# Patient Record
Sex: Male | Born: 1950 | Hispanic: No | Marital: Married | Smoking: Current every day smoker
Health system: Southern US, Community
[De-identification: ages and names within clinical notes are randomized; demographics above are authoritative.]

---

## 2010-04-02 ENCOUNTER — Encounter: Admission: RE | Admit: 2010-04-02 | Discharge: 2010-04-02 | Payer: Self-pay | Admitting: Specialist

## 2011-09-01 IMAGING — CR DG CHEST 1V
1 series · 1 of 1 positions shown · non-contrast
Comparison: None.

CLINICAL DATA: Positive PPD test.

CHEST - 1 VIEW

[view not recorded]
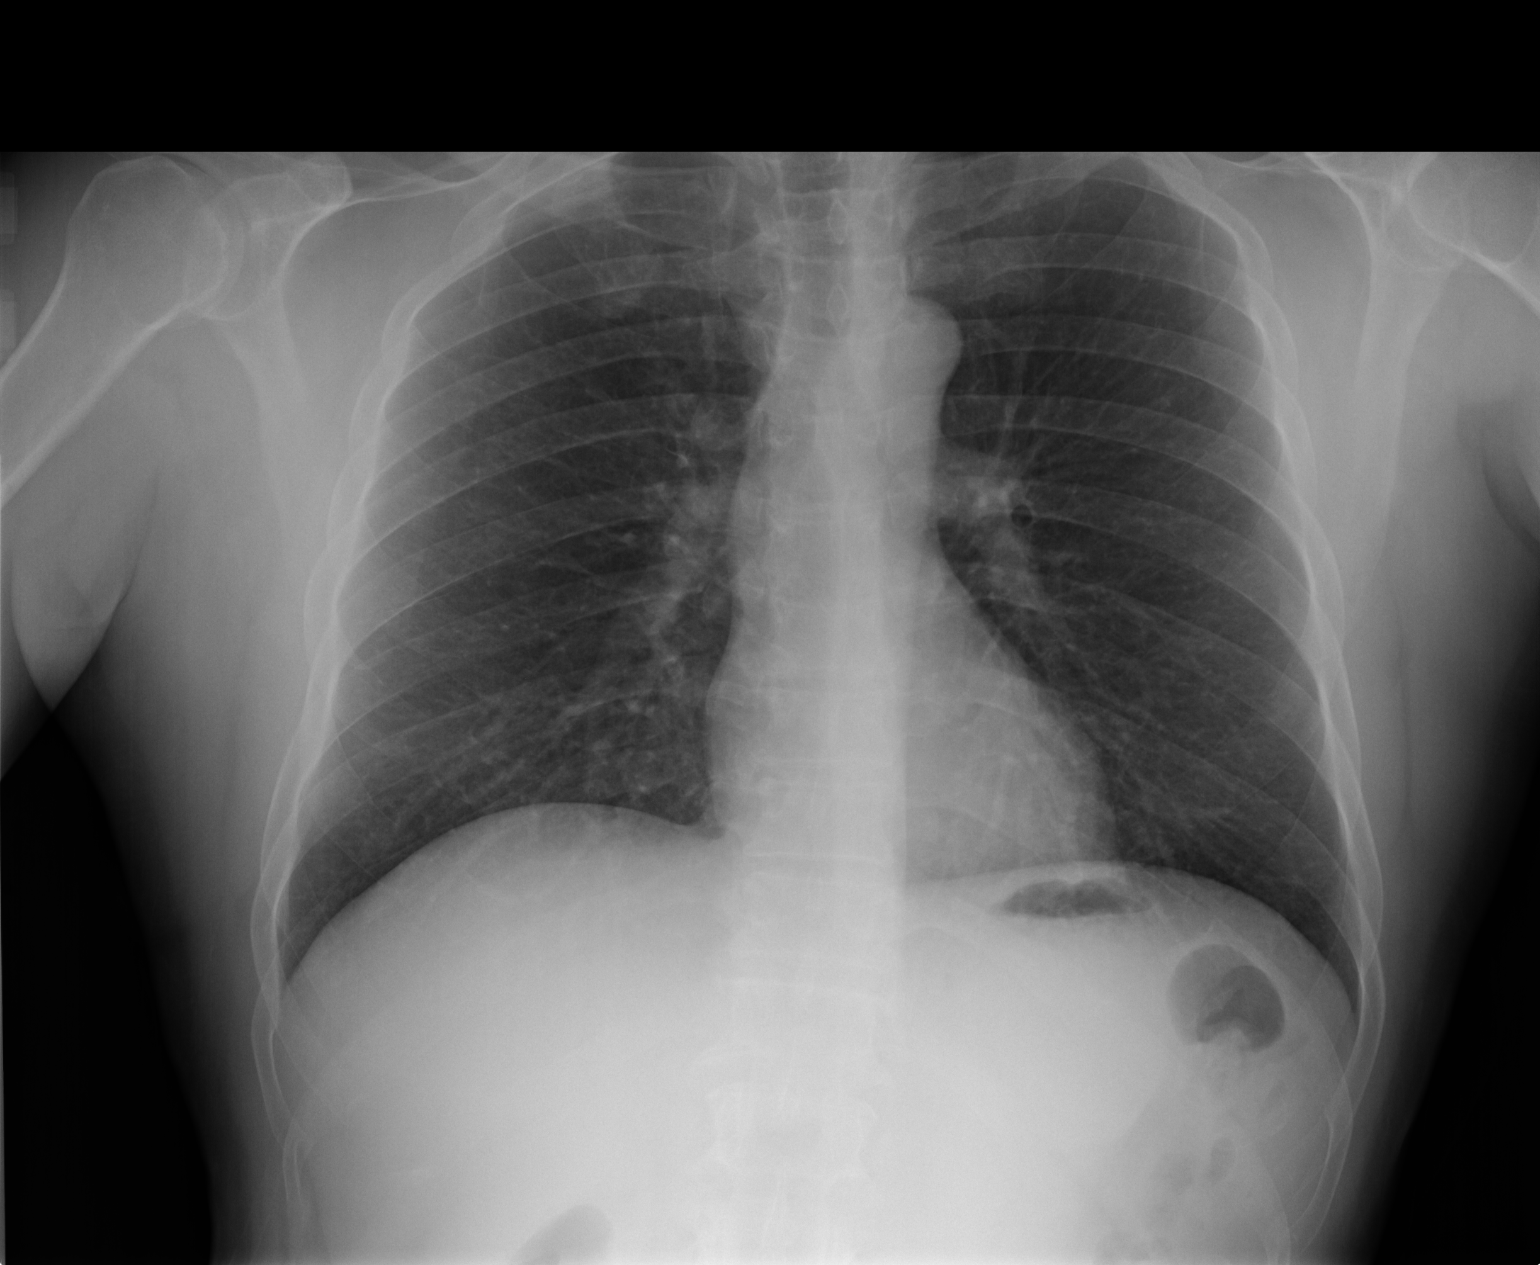

[1 of 1 positions shown; findings below may reference images not displayed]

FINDINGS: Trachea is midline.  Heart size normal.  There is mild
asymmetric prominence of the right first costochondral junction.
Lungs are clear.  No pleural fluid.
IMPRESSION: No acute findings.  No evidence of active tuberculosis.

## 2014-12-30 ENCOUNTER — Emergency Department (HOSPITAL_COMMUNITY): Payer: Self-pay

## 2014-12-30 ENCOUNTER — Encounter (HOSPITAL_COMMUNITY): Payer: Self-pay | Admitting: Nurse Practitioner

## 2014-12-30 ENCOUNTER — Emergency Department (HOSPITAL_COMMUNITY)
Admission: EM | Admit: 2014-12-30 | Discharge: 2014-12-30 | Disposition: A | Discharge status: Home / Self Care | Payer: Self-pay | Attending: Emergency Medicine | Admitting: Emergency Medicine

## 2014-12-30 DIAGNOSIS — Z791 Long term (current) use of non-steroidal anti-inflammatories (NSAID): Secondary | ICD-10-CM | POA: Insufficient documentation

## 2014-12-30 DIAGNOSIS — Z720 Tobacco use: Secondary | ICD-10-CM | POA: Insufficient documentation

## 2014-12-30 DIAGNOSIS — R0602 Shortness of breath: Secondary | ICD-10-CM | POA: Insufficient documentation

## 2014-12-30 DIAGNOSIS — R05 Cough: Secondary | ICD-10-CM | POA: Insufficient documentation

## 2014-12-30 LAB — BASIC METABOLIC PANEL
ANION GAP: 7 (ref 5–15)
BUN: 16 mg/dL (ref 6–23)
CHLORIDE: 102 mmol/L (ref 96–112)
CO2: 29 mmol/L (ref 19–32)
CREATININE: 0.8 mg/dL (ref 0.50–1.35)
Calcium: 9.6 mg/dL (ref 8.4–10.5)
GLUCOSE: 103 mg/dL — AB (ref 70–99)
POTASSIUM: 4.9 mmol/L (ref 3.5–5.1)
Sodium: 138 mmol/L (ref 135–145)

## 2014-12-30 LAB — CBC
HCT: 43.5 % (ref 39.0–52.0)
Hemoglobin: 15.4 g/dL (ref 13.0–17.0)
MCH: 28.7 pg (ref 26.0–34.0)
MCHC: 35.4 g/dL (ref 30.0–36.0)
MCV: 81.2 fL (ref 78.0–100.0)
Platelets: 231 10*3/uL (ref 150–400)
RBC: 5.36 MIL/uL (ref 4.22–5.81)
RDW: 12.9 % (ref 11.5–15.5)
WBC: 4.3 10*3/uL (ref 4.0–10.5)

## 2014-12-30 LAB — I-STAT TROPONIN, ED: TROPONIN I, POC: 0 ng/mL (ref 0.00–0.08)

## 2014-12-30 LAB — BRAIN NATRIURETIC PEPTIDE: B NATRIURETIC PEPTIDE 5: 32.8 pg/mL (ref 0.0–100.0)

## 2014-12-30 MED ORDER — ALBUTEROL SULFATE HFA 108 (90 BASE) MCG/ACT IN AERS
2.0000 | INHALATION_SPRAY | Freq: Once | RESPIRATORY_TRACT | Status: AC
  Administered 2014-12-30: 2 via RESPIRATORY_TRACT
  Filled 2014-12-30: qty 6.7

## 2014-12-30 MED ORDER — IPRATROPIUM-ALBUTEROL 0.5-2.5 (3) MG/3ML IN SOLN
3.0000 mL | RESPIRATORY_TRACT | Status: DC
  Administered 2014-12-30: 3 mL via RESPIRATORY_TRACT
  Filled 2014-12-30: qty 3

## 2014-12-30 NOTE — ED Notes (Signed)
Pt alert,oriented, and ambulatory upon DC. Lung sounds clear. Pt was advised to follow up with PCP ( resources provided).

## 2014-12-30 NOTE — ED Provider Notes (Signed)
CSN: [id number]     Arrival date & time 12/30/14  1505 History   First MD Initiated Contact with Patient 12/30/14 1522     Chief Complaint  Patient presents with  . Shortness of Breath     (Consider location/radiation/quality/duration/timing/severity/associated sxs/prior Treatment) Patient is a 64 y.o. male presenting with shortness of breath. The history is provided by the patient.  Shortness of Breath Severity:  Mild Onset quality:  Gradual Duration:  4 days Timing:  Sporadic Progression:  Unchanged Chronicity:  New Context: weather changes   Context: not activity, not animal exposure and not URI   Relieved by:  Nothing Worsened by:  Nothing tried Associated symptoms: cough (occasional, nonproductive)   Associated symptoms: no abdominal pain, no chest pain, no fever and no vomiting     History reviewed. No pertinent past medical history. History reviewed. No pertinent past surgical history. History reviewed. No pertinent family history. History  Substance Use Topics  . Smoking status: Current Every Day Smoker -- 2.00 packs/day    Types: Cigarettes  . Smokeless tobacco: Current User  . Alcohol Use: No    Review of Systems  Constitutional: Negative for fever.  Respiratory: Positive for cough (occasional, nonproductive) and shortness of breath.   Cardiovascular: Negative for chest pain and leg swelling.  Gastrointestinal: Negative for vomiting and abdominal pain.  All other systems reviewed and are negative.     Allergies  Review of patient's allergies indicates no known allergies.  Home Medications   Prior to Admission medications   Medication Sig Start Date End Date Taking? Authorizing Provider  naproxen sodium (ANAPROX) 220 MG tablet Take 440 mg by mouth daily as needed (pain).   Yes Historical Provider, MD   BP 147/79 mmHg  Pulse 68  Temp(Src) 98 F (36.7 C) (Oral)  Resp 16  Ht  (1.549 m)  Wt 172 lb (78.019 kg)  BMI 32.52 kg/m2 Physical Exam   Constitutional: He is oriented to person, place, and time. He appears well-developed and well-nourished. No distress.  HENT:  Head: Normocephalic and atraumatic.  Mouth/Throat: No oropharyngeal exudate.  Eyes: EOM are normal. Pupils are equal, round, and reactive to light.  Neck: Normal range of motion. Neck supple.  Cardiovascular: Normal rate and regular rhythm.  Exam reveals no friction rub.   No murmur heard. Pulmonary/Chest: Effort normal and breath sounds normal. No respiratory distress. He has no wheezes. He has no rales.  Abdominal: He exhibits no distension. There is no tenderness. There is no rebound.  Musculoskeletal: Normal range of motion. He exhibits no edema.  Neurological: He is alert and oriented to person, place, and time.  Skin: No rash noted. He is not diaphoretic.  Nursing note and vitals reviewed.   ED Course  Procedures (including critical care time) Labs Review Labs Reviewed - No data to display  Imaging Review Dg Chest 2 View  12/30/2014   CLINICAL DATA:  Shortness of breath for the past 4 days. Occasional smoker.  EXAM: CHEST  2 VIEW  COMPARISON:  04/02/2010.  FINDINGS: Normal sized heart. Clear lungs. Mild thoracolumbar spine degenerative changes.  IMPRESSION: No acute abnormality.   Electronically Signed   By: Beckie Salts M.D.   On: 12/30/2014 16:20     EKG Interpretation   Date/Time:  Sunday December 30 2014 15:19:12 EST Ventricular Rate:  69 PR Interval:  176 QRS Duration: 72 QT Interval:  441 QTC Calculation: 472 R Axis:   16 Text Interpretation:  Sinus rhythm Baseline wander  in lead(s) III No prior  for comparison Confirmed by [facility]  MD, Amiel Sharrow ([id number]) on 12/30/2014 3:23:30  PM      MDM   Final diagnoses:  Shortness of breath    64 year old male here with shortness of breath. He is [place] and has been here for 2 months. Began 4 days ago. Mild occasional cough. No chest pain, vomiting, fever. Better with going outside and getting fresh  air, no worsening factors. Occasional smoker. No medical problems. Here vitals are stable exam is benign. Will plan for chest x-ray, labs, EKG. CXR normal. Labs ok. Feeling better after Duoneb, given albuterol inhaler to go home. Could be due to humid air here, as we've had some severe weather changes in the past few weeks with a lot of rain. [place] is mostly dry.  Given albuterol inhaler to go home and resource guide to help him find a PCP.  Elwin MochaBlair Mica Releford, MD 12/30/14 [phone]

## 2014-12-30 NOTE — Discharge Instructions (Signed)
Shortness of Breath °Shortness of breath means you have trouble breathing. It could also mean that you have a medical problem. You should get immediate medical care for shortness of breath. °CAUSES  °· Not enough oxygen in the air such as with high altitudes or a smoke-filled room. °· Certain lung diseases, infections, or problems. °· Heart disease or conditions, such as angina or heart failure. °· Low red blood cells (anemia). °· Poor physical fitness, which can cause shortness of breath when you exercise. °· Chest or back injuries or stiffness. °· Being overweight. °· Smoking. °· Anxiety, which can make you feel like you are not getting enough air. °DIAGNOSIS  °Serious medical problems can often be found during your physical exam. Tests may also be done to determine why you are having shortness of breath. Tests may include: °· Chest X-rays. °· Lung function tests. °· Blood tests. °· An electrocardiogram (ECG). °· An ambulatory electrocardiogram. An ambulatory ECG records your heartbeat patterns over a 24-hour period. °· Exercise testing. °· A transthoracic echocardiogram (TTE). During echocardiography, sound waves are used to evaluate how blood flows through your heart. °· A transesophageal echocardiogram (TEE). °· Imaging scans. °Your health care provider may not be able to find a cause for your shortness of breath after your exam. In this case, it is important to have a follow-up exam with your health care provider as directed.  °TREATMENT  °Treatment for shortness of breath depends on the cause of your symptoms and can vary greatly. °HOME CARE INSTRUCTIONS  °· Do not smoke. Smoking is a common cause of shortness of breath. If you smoke, ask for help to quit. °· Avoid being around chemicals or things that may bother your breathing, such as paint fumes and dust. °· Rest as needed. Slowly resume your usual activities. °· If medicines were prescribed, take them as directed for the full length of time directed. This  includes oxygen and any inhaled medicines. °· Keep all follow-up appointments as directed by your health care provider. °SEEK MEDICAL CARE IF:  °· Your condition does not improve in the time expected. °· You have a hard time doing your normal activities even with rest. °· You have any new symptoms. °SEEK IMMEDIATE MEDICAL CARE IF:  °· Your shortness of breath gets worse. °· You feel light-headed, faint, or develop a cough not controlled with medicines. °· You start coughing up blood. °· You have pain with breathing. °· You have chest pain or pain in your arms, shoulders, or abdomen. °· You have a fever. °· You are unable to walk up stairs or exercise the way you normally do. °MAKE SURE YOU: °· Understand these instructions. °· Will watch your condition. °· Will get help right away if you are not doing well or get worse. °Document Released: 07/21/2001 Document Revised: 10/31/2013 Document Reviewed: 01/11/2012 °ExitCare® Patient Information ©2015 [employer]. This information is not intended to replace advice given to you by your health care provider. Make sure you discuss any questions you have with your health care provider. ° ° ° °Emergency Department Resource Guide °1) Find a Doctor and Pay Out of Pocket °Although you won't have to find out who is covered by your insurance plan, it is a good idea to ask around and get recommendations. You will then need to call the office and see if the doctor you have chosen will accept you as a new patient and what types of options they offer for patients who are self-pay. Some doctors   offer discounts or will set up payment plans for their patients who do not have insurance, but you will need to ask so you aren't surprised when you get to your appointment. ° °2) Contact Your Local Health Department °Not all health departments have doctors that can see patients for sick visits, but many do, so it is worth a call to see if yours does. If you don't know where your local health  department is, you can check in your phone book. The CDC also has a tool to help you locate your state's health department, and many state websites also have listings of all of their local health departments. ° °3) Find a Walk-in Clinic °If your illness is not likely to be very severe or complicated, you may want to try a walk in clinic. These are popping up all over the country in pharmacies, drugstores, and shopping centers. They're usually staffed by nurse practitioners or physician assistants that have been trained to treat common illnesses and complaints. They're usually fairly quick and inexpensive. However, if you have serious medical issues or chronic medical problems, these are probably not your best option. ° °No Primary Care Doctor: °- Call [facility] at  832-8000 - they can help you locate a primary care doctor that  accepts your insurance, provides certain services, etc. °- Physician Referral Service- 1-800-533-3463 ° °Chronic Pain Problems: °Organization         Address  Phone   Notes  ° [facility]  (336) 297-2271 Patients need to be referred by their primary care doctor.  ° °Medication Assistance: °Organization         Address  Phone   Notes  °[place] [facility] [address] °[place], [place] [address] (336) 641-8030 --Must be a resident of [place] °-- Must have NO insurance coverage whatsoever (no Medicaid/ Medicare, etc.) °-- The pt. MUST have a primary care doctor that directs their care regularly and follows them in the community °  °[facility]  (866) 331-1348   °[employer] Way  (888) 892-1162   ° °Agencies that provide inexpensive medical care: °Organization         Address  Phone   Notes  °[facility]  (336) 832-8035   °[facility]    (336) 832-7272   °[facility] [address] °[place], [place] [address] (336) 832-4777   °[facility] [address], °[place] (336) 271-4999   °[facility]    (336) 373-0678   °[facility]    (336) 272-1050   °[facility] ° [address], [place] Phone:  (336) 832-4444, Fax:  (336) 832-4440 Hours of Operation:  9 am - 6 pm, M-F.  Also accepts Medicaid/Medicare and self-pay.  °[facility] ° [address], [place] Phone: (336) 832-3150, Fax: (336) 832-3151. Hours of Operation:  8:30 am - 5:30 pm, M-F.  Also accepts Medicaid and self-pay.  °[facility] [place] [address], [place] Phone: (336) 878-6027   °[facility] [address], [place], [place] (336)723-1848, Ext. 123 Mondays & Thursdays: 7-9 AM.  First 15 patients are seen on a first come, first serve basis. °  ° °Medicaid-accepting [place] Providers: ° °Organization         Address  Phone   Notes  °[facility] [address], [place] (336) 641-2100 Also accepts self-pay patients.  °[facility] [address], [place] ° (336) 856-9996   °[facility] [address], [place] (336) 288-8857   °[facility] [address] [place] [address], [place] (336) 299-7000   °Veita Bland [address], [place]  ° (336) 373-1557 Only accepts [place] Access Medicaid patients after they have their name applied to their card.  ° °Self-Pay (no insurance) in [place]: ° °Organization         Address  Phone   Notes  °Sickle Cell Patients, [facility] [address], [place] (336) 832-1970   °[facility] [address], [place] (336) 832-4400   °[facility] [place] ° [address], [place] (336) 992-4800   °[facility]/Dr. Osei-Bonsu ° [address] [place] [address], [place] or [address], [place] (336) 841-8500 Phone number for both [place] and  [place] locations is the same.  °[facility] [address], [place] (336) 299-0000   °[facility] [address] [place] [address], [place] or [address] (336) 852-7530 °(336) 878-2260   °[facility] [address], [place] (336) 350-1642, phone; (336) 294-5005, fax Sees patients 1st and 3rd Saturday of every month.  Must not qualify for public or private insurance (i.e. Medicaid, Medicare, [place] Health Choice, Veterans' Benefits) • Household income should be no more than 200% of the poverty level •The clinic cannot treat you if you are pregnant or think you are pregnant • Sexually transmitted diseases are not treated at the clinic.  ° ° °Dental Care: °Organization         Address  Phone  Notes  °[facility] [address],  (336) 641-6152 Accepts children up to age 21 who are enrolled in Medicaid or [place] Health Choice; pregnant women with a Medicaid card; and children who have applied for Medicaid or [place] Health Choice, but were declined, whose parents can pay a reduced fee at time of service.  °[facility] [place]  [address], [place] (336) 641-7733 Accepts children up to age 21 who are enrolled in Medicaid or [place] Health Choice; pregnant women with a Medicaid card; and children who have applied for Medicaid or [place] Health Choice, but were declined, whose parents can pay a reduced fee at time of service.  °[facility] ° [address],  (336) 641-4533 Patients are seen by appointment only. Walk-ins are not accepted. [facility] will see patients 18 years of age and older. °Monday - Tuesday (8am-5pm) °Most Wednesdays (8:30-5pm) °$30 per visit, cash only  °[facility] ° [address], [place] (336) 641-4533 Patients are seen by appointment only. Walk-ins are not accepted.  [facility] will see patients 18 years of age and older. °One Wednesday Evening (Monthly: Volunteer Based).  $30 per visit, cash only  °[facility]  (919) 537-3737 for adults; Children under age 4, call Graduate Pediatric Dentistry at (919) [phone]. Children aged 4-14, please call (919) [phone] to request a pediatric application. ° Dental services are provided in all areas of dental care including fillings, crowns and bridges, complete and partial dentures, implants, gum treatment, root canals, and extractions. Preventive care   is also provided. Treatment is provided to both adults and children. °Patients are selected via a lottery and there is often a waiting list. °  °[facility] [address], °[place] ° (336) 763-8833 [email or web address] °  °[facility] [address], [place], [place] (336)723-1848, Ext. 123 Second and Fourth Thursday of each month, opens at 6:30 AM; Clinic ends at 9 AM.  Patients are seen on a first-come first-served basis, and a limited number are seen during each clinic.  ° °[facility] ° [address], [place], [place] (336) 723-7904   Eligibility Requirements °You must have lived in [place], [place], or [place] counties for at least the last three months. °  You cannot be eligible for state or federal sponsored healthcare insurance, including Veterans Administration, Medicaid, or Medicare. °  You generally cannot be eligible for healthcare insurance through your employer.  °  How to apply: °Eligibility screenings are held every Tuesday and Wednesday afternoon from 1:00 pm until 4:00 pm. You do not need an appointment for the interview!  °[facility] [address], [place], [place] 336-631-2330   °[facility]  336-342-8273   °[facility]  336-703-3100   °[facility]  336-570-6415   ° °Behavioral Health Resources in the  Community: °Intensive Outpatient Programs °Organization         Address  Phone  Notes  °[facility] [address], [place], [place] 336-878-6098   °[facility] [address], [place], [place] 336-832-9800   °[facility] [address], [place], [place] ° 336-882-2125   °[facility] [address],  °[place], [place] 1-800-853-5163 or 336-641-4981   °Substance Abuse Resources °Organization         Address  Phone  Notes  °[facility]  336-882-2125   °[facility]  336-784-9470   °[facility]  336-285-9073   °[facility]  336-845-3988   °Residential & Outpatient Substance Abuse Program  1-800-659-3381   °Psychological Services °Organization         Address  Phone  Notes  °[facility]  336- 832-9600   °[facility]  336- 378-7881   °[facility] [address], [place] 1-800-853-5163 or 336-641-4981   ° °Mobile Crisis Teams °Organization         Address  Phone  Notes  °[facility], Mobile Crisis Care Unit  1-877-626-1772   °Assertive °[facility] ° [address] [place], [place] 336-834-9664   °Sharon DeEsch [address] °[place] 336-554-5454   ° °Self-Help/Support Groups °Organization         Address  Phone             Notes  °[facility] - variety of support groups  336- 373-1402 Call for more information  °Narcotics Anonymous (NA), [facility] [address], °[place]  2 meetings at this location  ° °Residential Treatment Programs °Organization         Address  Phone  Notes  °[facility] [address],    °[place]  1-866-801-8205   °[facility] ° [address], [place], [place] 704-293-8524   °[facility] [address], [place] 336-845-3988 Admissions: 8am-3pm M-F  °[facility] [address]    °  [place], [place] 336-841-1104   °[facility] [address], [place], [place] 336-379-7146   °[facility] [address]  °[place], [place] 336-285-9073   °[facility] Intensive Outpatient [address], [place], [place] 336-852-3033   °[facility] [address]  °[place], [place] 1-877-615-2722 or 336-784-9470   °[facility] ([facility] [address] [place], [place] 336-227-7417 Accepts Medicaid  °[facility] [address]  ° [place] 1-800-659-3381 Substance Abuse/Addiction Treatment  ° °[place] Behavioral Health Resources °Organization         Address  Phone  Notes  °[facility]  (888) 581-9988   °Julie Brannon, PhD [address] [place], [place]   (336) 349-5553 or (336) 951-0000   °[facility]   [address] °[place], [place] (336) 349-4454   °[facility] [address], [place], [place] (336) 342-8316 Insurance/Medicaid/sponsorship through [facility]  °[facility] [address]                                    [place], [place] (336) 342-8316 Therapy/tele-psych/case  °[facility] [address]  ° [place], [place] (336) 349-2233    °Dr. Arfeen  (336) 349-4544   °[facility] [place]  [employer] [place] [facility] 1) [address],  °2) [address], [place] °3)  [address], [place] (336) 349-3220 °(336) 342-7768 ° °(336) 342-8140   °[place] [facility] (336) 342-1394 or (336) 342-3537 (After Hours)    ° ° ° °

## 2014-12-30 NOTE — ED Notes (Signed)
Pt presents with c/o of shortness of breath onset 4 days ago, further describes this symptoms as "not being able to get enough air in" when he inhales. Of note pt travelled to [place] 2 [place] ago, states he was well until 4 days ago. Denies chest pain, n/v/d, fevers or chills. Pt in NAD

## 2016-05-30 IMAGING — CR DG CHEST 2V
2 series · 2 of 2 positions shown · non-contrast
Comparison: 04/02/2010.

CLINICAL DATA: Shortness of breath for the past 4 days. Occasional
smoker.

EXAM:
CHEST  2 VIEW

[w chest pa]
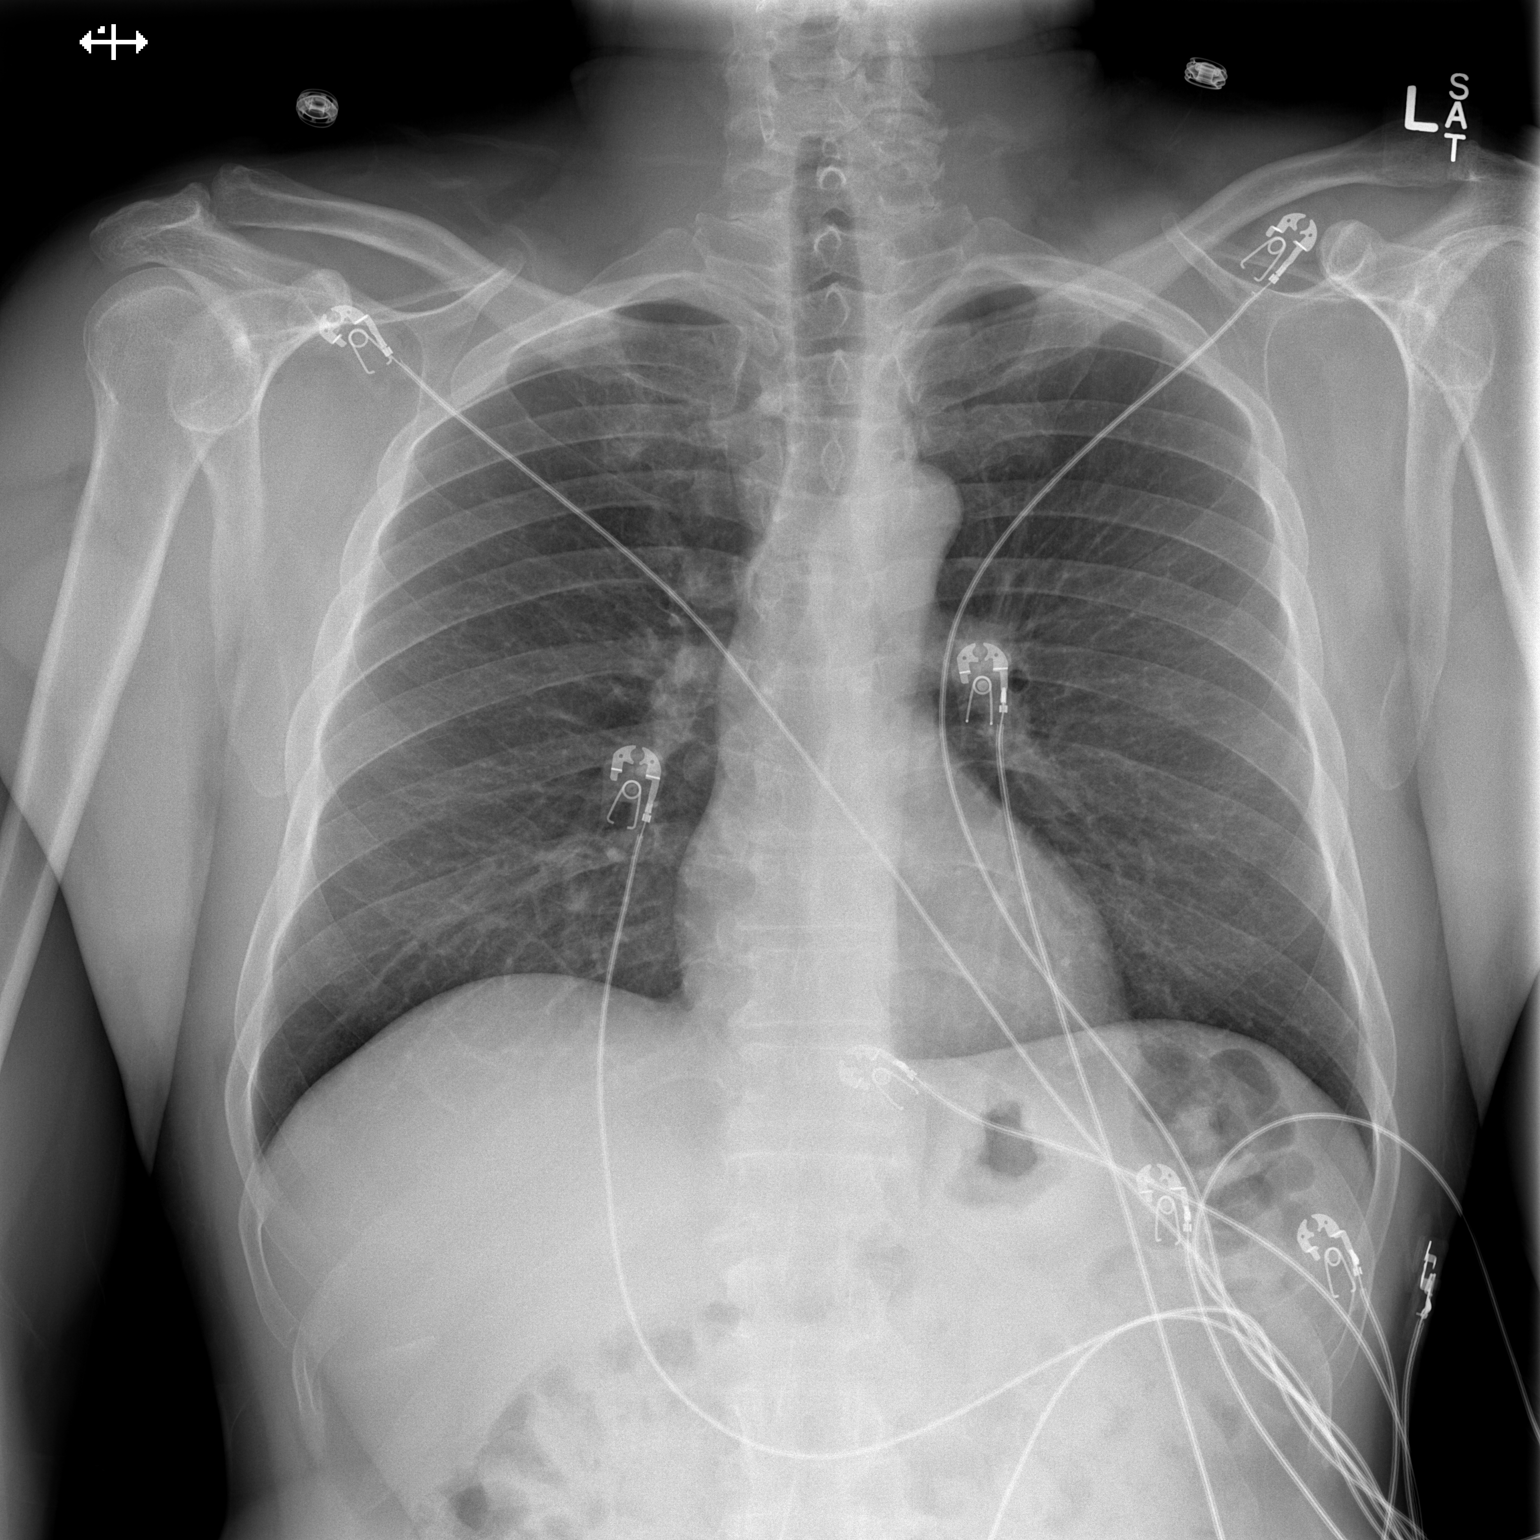

[w chest lat]
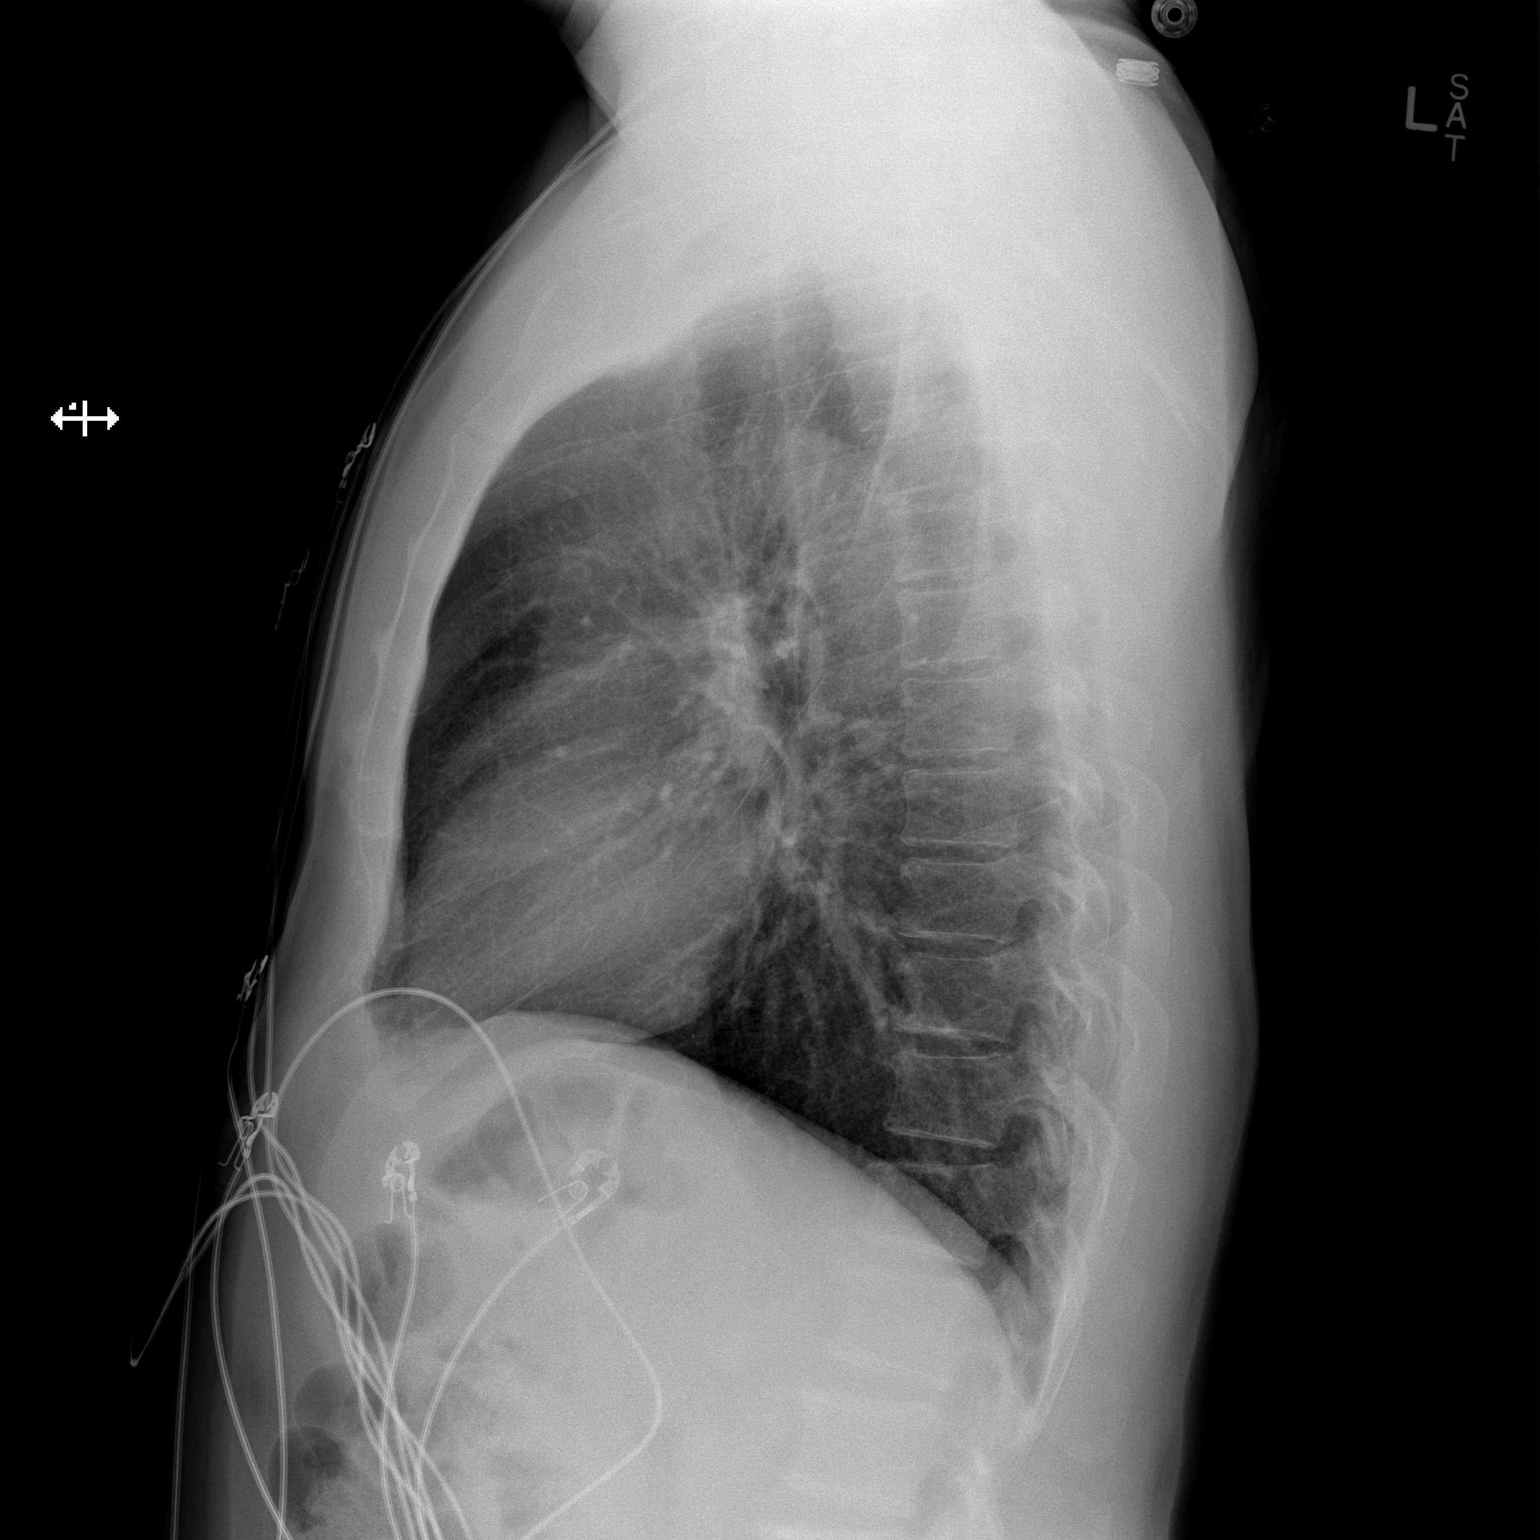

[2 of 2 positions shown; findings below may reference images not displayed]

FINDINGS: Normal sized heart. Clear lungs. Mild thoracolumbar spine
degenerative changes.
IMPRESSION: No acute abnormality.
